# Patient Record
Sex: Male | Born: 1970 | Race: White | Hispanic: No | Marital: Single | Smoking: Current every day smoker
Health system: Southern US, Community
[De-identification: ages and names within clinical notes are randomized; demographics above are authoritative.]

---

## 2015-08-08 ENCOUNTER — Encounter (HOSPITAL_COMMUNITY): Payer: Self-pay | Admitting: Emergency Medicine

## 2015-08-08 ENCOUNTER — Emergency Department (HOSPITAL_COMMUNITY)
Admission: EM | Admit: 2015-08-08 | Discharge: 2015-08-08 | Disposition: A | Discharge status: Home / Self Care | Payer: Self-pay | Attending: Emergency Medicine | Admitting: Emergency Medicine

## 2015-08-08 DIAGNOSIS — E860 Dehydration: Secondary | ICD-10-CM | POA: Insufficient documentation

## 2015-08-08 DIAGNOSIS — F17210 Nicotine dependence, cigarettes, uncomplicated: Secondary | ICD-10-CM | POA: Insufficient documentation

## 2015-08-08 LAB — CBC WITH DIFFERENTIAL/PLATELET
BASOS ABS: 0.1 10*3/uL (ref 0.0–0.1)
BASOS PCT: 1 %
Eosinophils Absolute: 0.3 10*3/uL (ref 0.0–0.7)
Eosinophils Relative: 4 %
HEMATOCRIT: 41.5 % (ref 39.0–52.0)
HEMOGLOBIN: 13.9 g/dL (ref 13.0–17.0)
LYMPHS PCT: 28 %
Lymphs Abs: 2 10*3/uL (ref 0.7–4.0)
MCH: 30.8 pg (ref 26.0–34.0)
MCHC: 33.5 g/dL (ref 30.0–36.0)
MCV: 92 fL (ref 78.0–100.0)
MONO ABS: 0.5 10*3/uL (ref 0.1–1.0)
Monocytes Relative: 7 %
NEUTROS ABS: 4.2 10*3/uL (ref 1.7–7.7)
NEUTROS PCT: 60 %
Platelets: 224 10*3/uL (ref 150–400)
RBC: 4.51 MIL/uL (ref 4.22–5.81)
RDW: 12.7 % (ref 11.5–15.5)
WBC: 7.1 10*3/uL (ref 4.0–10.5)

## 2015-08-08 LAB — BASIC METABOLIC PANEL
ANION GAP: 7 (ref 5–15)
BUN: 15 mg/dL (ref 6–20)
CHLORIDE: 105 mmol/L (ref 101–111)
CO2: 27 mmol/L (ref 22–32)
CREATININE: 0.78 mg/dL (ref 0.61–1.24)
Calcium: 8.7 mg/dL — ABNORMAL LOW (ref 8.9–10.3)
GFR calc non Af Amer: >60 mL/min (ref >60)
Glucose, Bld: 92 mg/dL (ref 65–99)
POTASSIUM: 3.7 mmol/L (ref 3.5–5.1)
Sodium: 139 mmol/L (ref 135–145)

## 2015-08-08 MED ORDER — SODIUM CHLORIDE 0.9 % IV BOLUS (SEPSIS)
1000.0000 mL | Freq: Once | INTRAVENOUS | Status: AC
  Administered 2015-08-08: 1000 mL via INTRAVENOUS

## 2015-08-08 MED ORDER — PROMETHAZINE HCL 25 MG PO TABS: 25.0000 mg | ORAL_TABLET | Freq: Four times a day (QID) | ORAL | Status: AC | PRN

## 2015-08-08 NOTE — ED Notes (Signed)
Pt states he has been having vomiting since yesterday morning.  Denies abd pain or diarrhea.

## 2015-08-08 NOTE — ED Provider Notes (Signed)
CSN: [id number]     Arrival date & time 08/08/15  1845 History  By signing my name below, I, Rosario AdieWilliam Andrew Hiatt, attest that this documentation has been prepared under the direction and in the presence of Eber HongBrian Hughes Wyndham, MD.  Electronically Signed: Rosario AdieWilliam Andrew Hiatt, ED Scribe. 08/08/2015. 9:02 PM.   Chief Complaint  Patient presents with  . Emesis   The history is provided by the patient. No language interpreter was used.   HPI Comments: Roy Brewer is a 45 y.o. male who presents to the Emergency Department complaining of gradual onset, gradually worsening, constant fatigue onset 1 day PTA. He also reports that he has had one episode of emesis at the onset of his symptoms, decreased appetite, and cramping in his extremities. 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 He states that he has had similar symptoms in the past during hot weather, but he was able to remedy them by drinking more water or Gatorade. Pt has been drinking water and Gatorade since the onset of his symptoms with no relief. Pt currently is a 1ppd smoker. Pt denies HA, blurred vision, diarrhea, any pain, numbness, or tingling.  History reviewed. No pertinent past medical history. History reviewed. No pertinent past surgical history. History reviewed. No pertinent family history. Social History  Substance Use Topics  . Smoking status: Current Every Day Smoker -- 1.00 packs/day    Types: Cigarettes  . Smokeless tobacco: None  . Alcohol Use: No    Review of Systems  Constitutional: Positive for fatigue.  Eyes: Negative for visual disturbance.  Gastrointestinal: Positive for vomiting.  Neurological: Negative for numbness and headaches.       Negative for tingling.  All other systems reviewed and are negative.  Allergies  Review of patient's allergies indicates no known allergies.  Home Medications   Prior to Admission medications   Medication Sig Start  Date End Date Taking? Authorizing Provider  promethazine (PHENERGAN) 25 MG tablet Take 1 tablet (25 mg total) by mouth every 6 (six) hours as needed for nausea or vomiting. 08/08/15   Eber HongBrian Amri Lien, MD   BP 119/79 mmHg  Pulse 58  Temp(Src) 97.9 F (36.6 C) (Oral)  Resp 16  Ht 5\' 9"  (1.753 m)  Wt 130 lb (58.968 kg)  BMI 19.19 kg/m2  SpO2 100%   Physical Exam  Constitutional: He is oriented to person, place, and time. He appears well-developed and well-nourished. No distress.  HENT:  Head: Normocephalic and atraumatic.  Mouth/Throat: Oropharynx is clear and moist. No oropharyngeal exudate.  Eyes: Conjunctivae and EOM are normal. Pupils are equal, round, and reactive to light. Right eye exhibits no discharge. Left eye exhibits no discharge. No scleral icterus.  Neck: Normal range of motion. Neck supple. No JVD present. No thyromegaly present.  Cardiovascular: Normal rate, regular rhythm, normal heart sounds and intact distal pulses.  Exam reveals no gallop and no friction rub.   No murmur heard. Pulmonary/Chest: Effort normal and breath sounds normal. No respiratory distress. He has no wheezes. He has no rales.  Abdominal: Soft. Bowel sounds are normal. He exhibits no distension and no mass. There is no tenderness.  Musculoskeletal: Normal range of motion. He exhibits no edema or tenderness.  Lymphadenopathy:    He has no cervical adenopathy.  Neurological: He is alert and oriented to person, place, and time. Coordination normal.  Skin: Skin is warm and dry. No rash noted. He  is not diaphoretic. No erythema.  Psychiatric: He has a normal mood and affect. His behavior is normal.  Nursing note and vitals reviewed.  ED Course  Procedures (including critical care time)  DIAGNOSTIC STUDIES: Oxygen Saturation is 100% on RA, normal by my interpretation.   COORDINATION OF CARE: 9:01 PM-Discussed next steps with pt including CBC and BMP. Pt verbalized understanding and is agreeable with  the plan.   Labs Review Labs Reviewed  BASIC METABOLIC PANEL - Abnormal; Notable for the following:    Calcium 8.7 (*)    All other components within normal limits  CBC WITH DIFFERENTIAL/PLATELET    Imaging Review No results found.  I have personally reviewed and evaluated these images and lab results as part of my medical decision-making.  MDM   Final diagnoses:  Dehydration    I personally performed the services described in this documentation, which was scribed in my presence. The recorded information has been reviewed and is accurate.    Well appearing VS and labs normal Fluids and home Pt in agreement.  Eber HongBrian Khori Rosevear, MD 08/08/15 [phone]

## 2015-08-08 NOTE — Discharge Instructions (Signed)
Phenergan for nausea Drink plenty of fluids [facility] Doctor List    Kari BaarsEdward Hawkins MD. Specialty: Pulmonary Disease Contact information: [address]  [address]  [place] [address]  [phone]   Syliva OvermanMargaret Simpson, MD. Specialty: [facility] Contact information: [address]  [place] [address]  [phone]   Lilyan PuntScott Luking, MD. Specialty: [facility] Contact information: [address]  [address]  [place] [address]  [phone]   Avon Gullyesfaye Fanta, MD Specialty: Internal Medicine Contact information: [address]  [place] [address]  [phone]   Catalina PizzaZach Hall, MD. Specialty: Internal Medicine Contact information: [address]  [place] [address]  [phone]   Butch PennyAngus Mcinnis, MD. Specialty: Family Medicine Contact information: [address]  [place] [address]  [phone]   John GiovanniStephen Knowlton, MD. Specialty: [facility] Contact information: [address]  [address]  [place] [address]  [phone]   Carylon Perchesoy Fagan, MD. Specialty: Internal Medicine Contact information: [address]  [address]  [place] [address]  [phone]

## 2015-08-14 ENCOUNTER — Ambulatory Visit (INDEPENDENT_AMBULATORY_CARE_PROVIDER_SITE_OTHER): Payer: Self-pay | Admitting: Otolaryngology

## 2015-08-14 DIAGNOSIS — K1230 Oral mucositis (ulcerative), unspecified: Secondary | ICD-10-CM

## 2016-03-21 ENCOUNTER — Ambulatory Visit (INDEPENDENT_AMBULATORY_CARE_PROVIDER_SITE_OTHER): Payer: BLUE CROSS/BLUE SHIELD | Admitting: Otolaryngology

## 2016-03-21 DIAGNOSIS — K1123 Chronic sialoadenitis: Secondary | ICD-10-CM | POA: Diagnosis not present

## 2016-03-22 ENCOUNTER — Other Ambulatory Visit (INDEPENDENT_AMBULATORY_CARE_PROVIDER_SITE_OTHER): Payer: Self-pay | Admitting: Otolaryngology

## 2016-03-22 DIAGNOSIS — R221 Localized swelling, mass and lump, neck: Secondary | ICD-10-CM

## 2016-03-29 ENCOUNTER — Ambulatory Visit (HOSPITAL_COMMUNITY)
Admission: RE | Admit: 2016-03-29 | Discharge: 2016-03-29 | Disposition: A | Discharge status: Home / Self Care | Payer: BLUE CROSS/BLUE SHIELD | Source: Ambulatory Visit | Attending: Otolaryngology | Admitting: Otolaryngology

## 2016-03-29 DIAGNOSIS — R221 Localized swelling, mass and lump, neck: Secondary | ICD-10-CM | POA: Diagnosis present

## 2016-03-29 MED ORDER — IOPAMIDOL (ISOVUE-300) INJECTION 61%
75.0000 mL | Freq: Once | INTRAVENOUS | Status: AC | PRN
  Administered 2016-03-29: 75 mL via INTRAVENOUS

## 2016-06-03 ENCOUNTER — Ambulatory Visit (INDEPENDENT_AMBULATORY_CARE_PROVIDER_SITE_OTHER): Payer: BLUE CROSS/BLUE SHIELD | Admitting: Otolaryngology

## 2017-07-05 IMAGING — CT CT NECK W/ CM
3 of 8 series · 11 of 33 positions shown, 13 images · IV contrast (iopamidol)
Comparison: None.

CLINICAL DATA: 46-year-old male with mass discovered along the left
mandible 2 months ago. Initial encounter.

EXAM:
CT NECK WITH CONTRAST
TECHNIQUE: Multidetector CT imaging of the neck was performed using the
standard protocol following the bolus administration of intravenous
contrast.
CONTRAST:  75mL [id number] IOPAMIDOL ([id number]) INJECTION 61%

[Series 10: coronal neck · coronal · 0.48mm/px · 1 of 135 slices shown]
[im 68/135  bone]
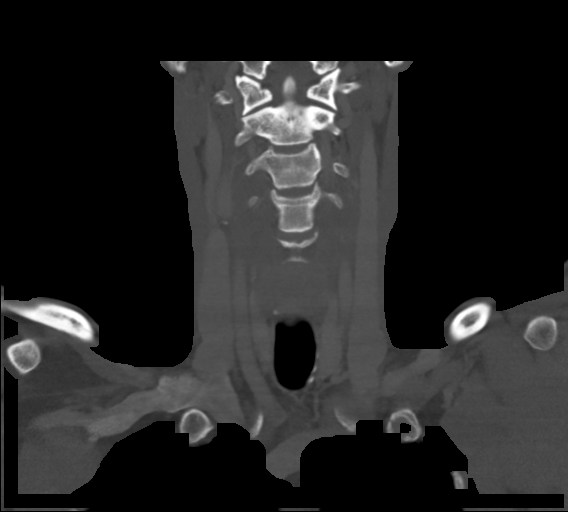

[Series 12: sagittal neck · sagittal · 0.48mm/px · 5 of 101 slices shown]
[im 17/101  bone]
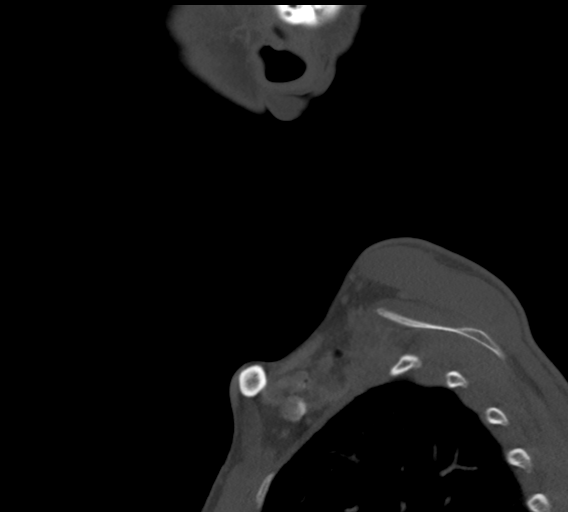
[im 34/101  bone]
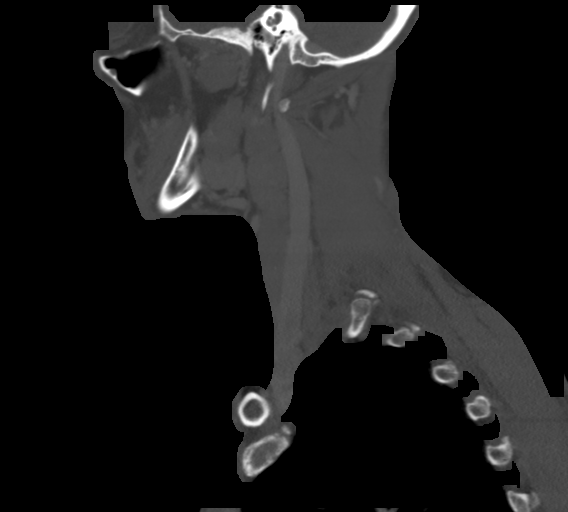
[im 51/101  bone]
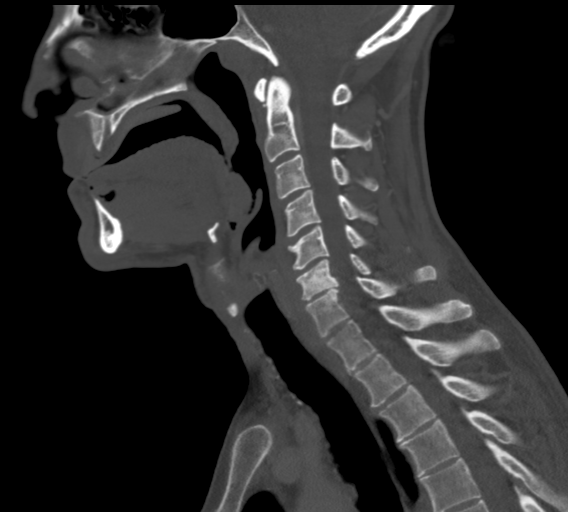
[im 67/101  bone]
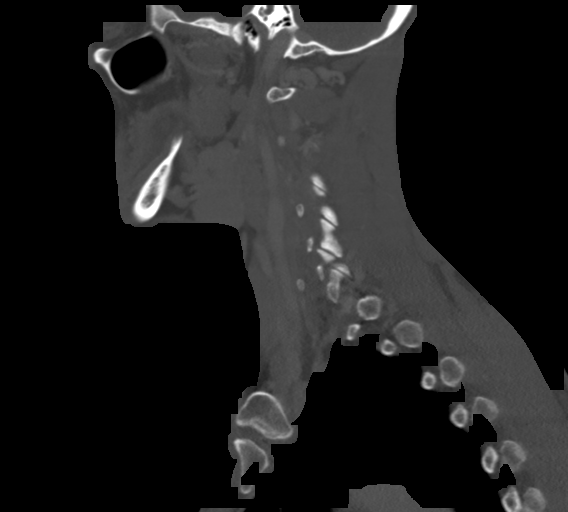
[im 84/101  bone]
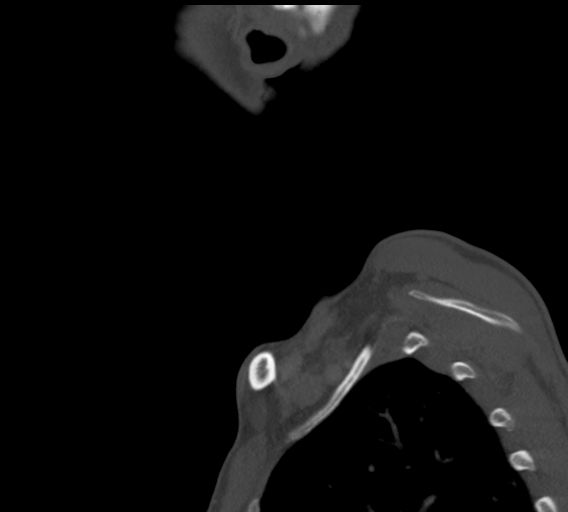

[Series 14: orthogonal ax · axial · 0.39mm/px · z∈[+1508,+1681]mm · 5 of 147 slices shown, 7 images]
[im 25/147  soft-tissue]
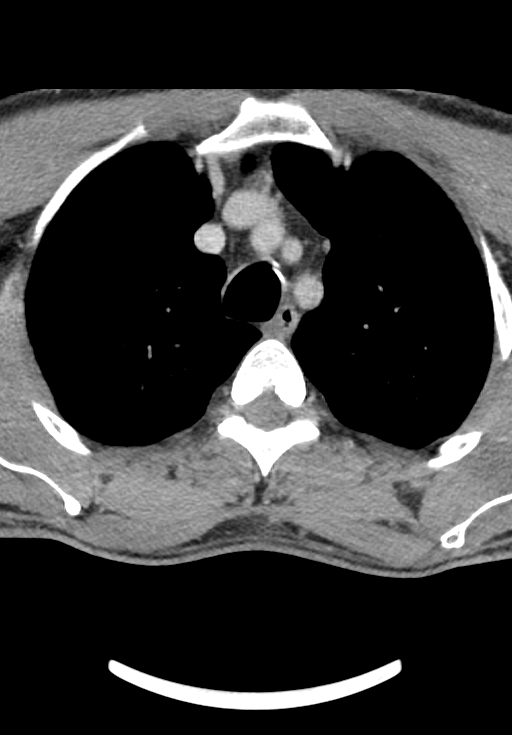
[im 25/147  bone]
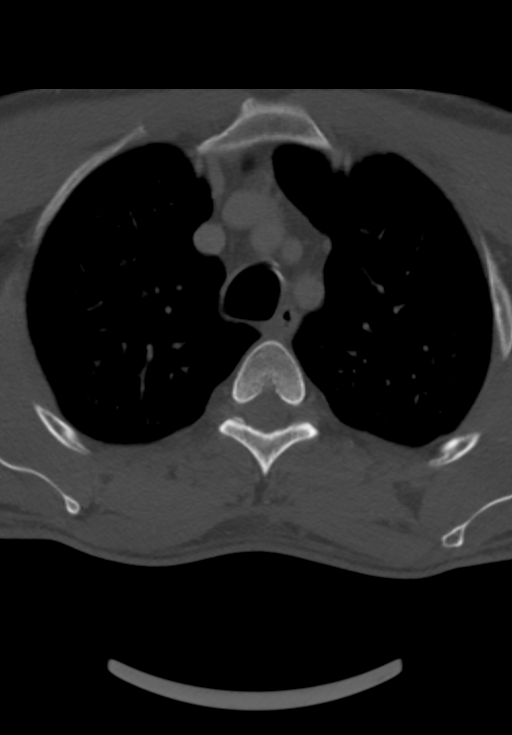
[im 49/147  bone]
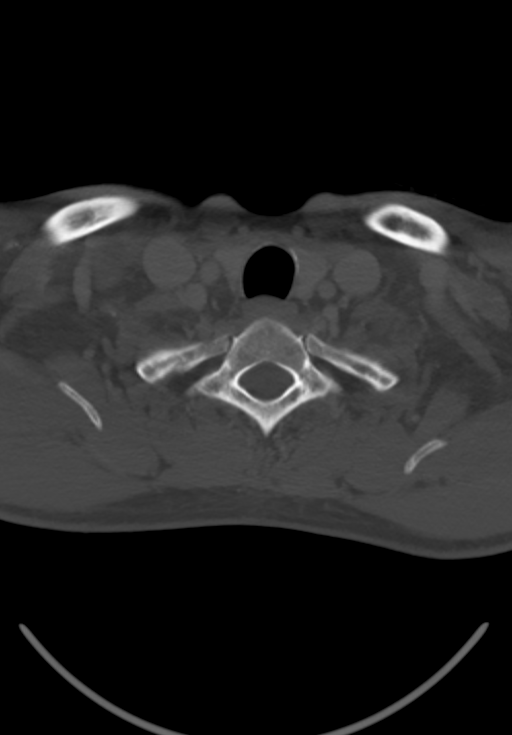
[im 74/147  bone]
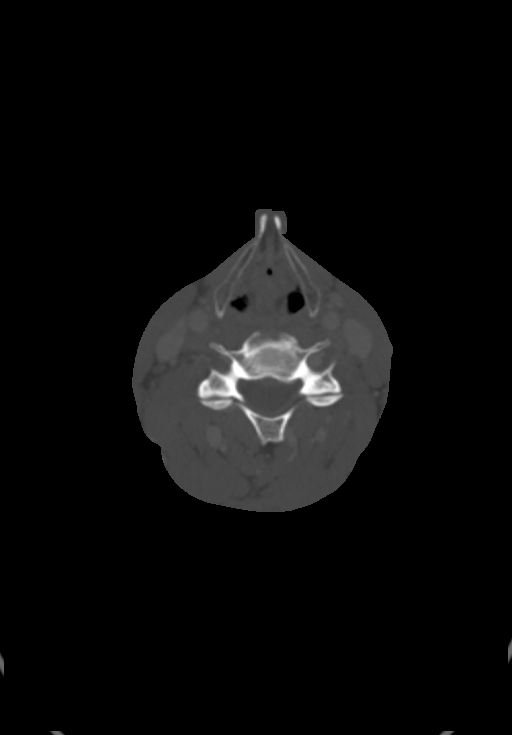
[im 98/147  bone]
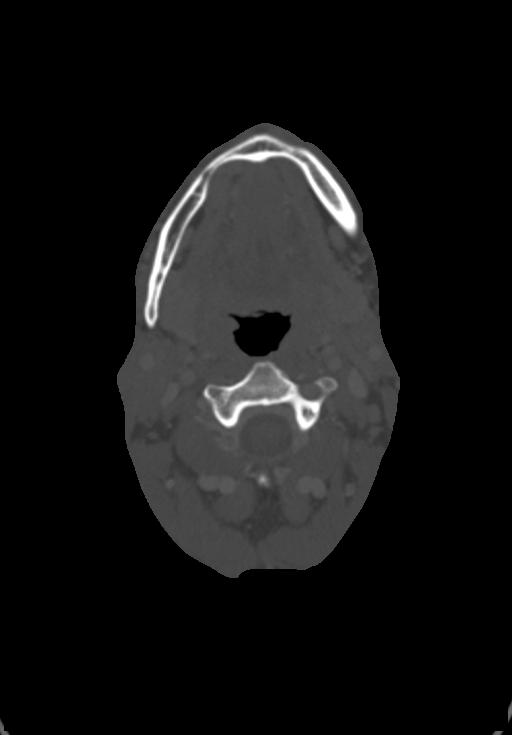
[im 122/147  soft-tissue]
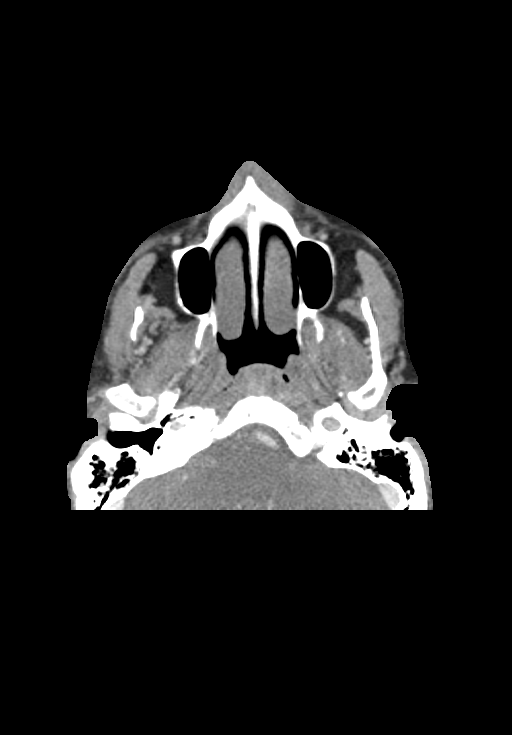
[im 122/147  bone]
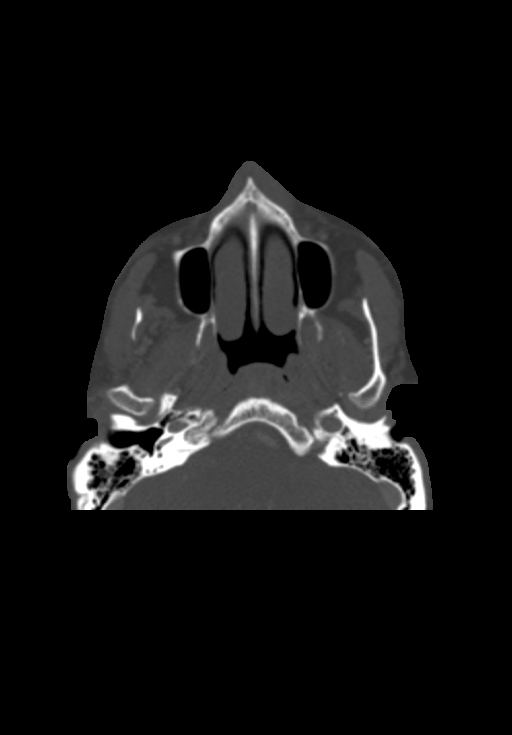

[11 of 33 positions shown; findings below may reference images not displayed]

FINDINGS: Pharynx and larynx: The larynx is within normal limits. There is
partial effacement of the vallecula but this appears related to
lingual tonsil hypertrophy. The hypopharynx is otherwise normal.
There is symmetric appearing mild palatine tonsillar enlargement.
There are small round retention cysts suspected along the anterior
aspect of both tonsillar pillars on series 2, image 34. Less adenoid
hypertrophy, nasopharynx within normal limits.

Parapharyngeal and retropharyngeal spaces are within normal limits.

The oral tongue appears unremarkable.

Salivary glands: Marked area of clinical concern is along the
inferior aspect of the left submandibular gland. The submandibular
glands appear symmetric without definite surrounding inflammation.
Negative sublingual space. No sialolithiasis identified.

Both parotid glands are within normal limits

Thyroid: Negative.

Lymph nodes: Mildly enlarged bilateral level 2A lymph nodes,
measuring up to 11-12 mm short axis bilaterally (series 2, image 47
on the left which is near the marked area of clinical concern, and
image 43 on the right). Level 1 lymph nodes are small and within
normal limits. Bilateral IIb lymph nodes are small but seen
increased in number. Level 3 lymph nodes also seen increased in
number and on the left measure up to 6-7 mm short axis. No level 4
or level 5 lymphadenopathy. No cystic or necrotic nodes.

Vascular: Major vascular structures in the neck and at the skullbase
are patent. The left vertebral artery is dominant. The right
vertebral is diminutive.

Limited intracranial: Negative.

Visualized orbits: Negative.

Mastoids and visualized paranasal sinuses: Clear.

Skeleton: Absent dentition. Lower cervical spine disc and endplate
degeneration. No acute osseous abnormality identified.

Upper chest: Trace probable retained secretions along the ventral
wall of the trachea in the upper chest (series 8, image 112).
Negative lung apices. Negative visualized mediastinum. No axillary
lymphadenopathy.
IMPRESSION: 1. The marked area of clinical concern is at the level of the left
submandibular space, but the left submandibular gland appears within
normal limits.
2. Mildly increased bilateral level 2 and level 3 lymph nodes but
with a nonspecific appearance. These could be reactive, and there is
lingual and palatine tonsillar hypertrophy. Query URI. No cystic or
necrotic nodes. No superior mediastinal or axillary lymphadenopathy.
Recommend clinical follow-up of these nodes, with consideration of a
lymphoproliferative disorder if there is further enlargement.
